# Patient Record
Sex: Male | Born: 1985 | Race: White | Hispanic: No | Marital: Single | Smoking: Never smoker
Health system: Southern US, Community
[De-identification: ages and names within clinical notes are randomized; demographics above are authoritative.]

## PROBLEM LIST (undated history)

## (undated) DIAGNOSIS — E669 Obesity, unspecified: Secondary | ICD-10-CM

## (undated) DIAGNOSIS — J302 Other seasonal allergic rhinitis: Secondary | ICD-10-CM

## (undated) DIAGNOSIS — I472 Ventricular tachycardia, unspecified: Secondary | ICD-10-CM

## (undated) HISTORY — DX: Ventricular tachycardia, unspecified: I47.20

## (undated) HISTORY — PX: V-TACH ABLATION: SHX6186

## (undated) HISTORY — PX: TONSILLECTOMY AND ADENOIDECTOMY: SUR1326

## (undated) HISTORY — DX: Other seasonal allergic rhinitis: J30.2

## (undated) HISTORY — DX: Obesity, unspecified: E66.9

## (undated) HISTORY — DX: Ventricular tachycardia: I47.2

---

## 2015-08-01 DIAGNOSIS — I472 Ventricular tachycardia, unspecified: Secondary | ICD-10-CM | POA: Insufficient documentation

## 2015-08-01 DIAGNOSIS — E669 Obesity, unspecified: Secondary | ICD-10-CM | POA: Insufficient documentation

## 2015-08-01 DIAGNOSIS — J302 Other seasonal allergic rhinitis: Secondary | ICD-10-CM | POA: Insufficient documentation

## 2015-08-15 ENCOUNTER — Ambulatory Visit (INDEPENDENT_AMBULATORY_CARE_PROVIDER_SITE_OTHER): Payer: BLUE CROSS/BLUE SHIELD | Admitting: Internal Medicine

## 2015-08-15 ENCOUNTER — Encounter (INDEPENDENT_AMBULATORY_CARE_PROVIDER_SITE_OTHER): Payer: Self-pay

## 2015-08-15 VITALS — BP 124/78 | HR 78 | Ht 69.0 in | Wt 230.6 lb

## 2015-08-15 DIAGNOSIS — I472 Ventricular tachycardia, unspecified: Secondary | ICD-10-CM

## 2015-08-15 NOTE — Progress Notes (Signed)
      HPI [name] is referred today by Dr. Eula ListenHussain for evaluation of palpitations. 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 In the interim he has had no reucrrent palpitations and no SVT symptoms or documented SVT. The patient was trying to lose weight and was taking phenteramine when he experienced recurrent palpitations which were thought to be due to sinus tachycardia. He has stopped these stimulants and his palpitations are essentially resolved. The patient has never had syncope. He has not been on any AV nodal blocking drugs. No Known Allergies   Current Outpatient Prescriptions  Medication Sig Dispense Refill  . fexofenadine (ALLEGRA) 180 MG tablet Take 180 mg by mouth daily.     No current facility-administered medications for this visit.     Past Medical History  Diagnosis Date  . V-tach (HCC)     HX  . Obesity   . Seasonal allergies     ROS:   All systems reviewed and negative except as noted in the HPI.   Past Surgical History  Procedure Laterality Date  . V-tach ablation  [id number]    [place]  . Tonsillectomy and adenoidectomy       Family History  Problem Relation Age of Onset  . Diabetes Father     DM  . Congestive Heart Failure Father     PACEMAKER  . Heart disease Father     PACEMAKER  . Healthy Mother   . Healthy Brother   . Healthy Sister      Social History   Social History  . Marital Status: Single    Spouse Name: N/A  . Number of Children: N/A  . Years of Education: N/A   Occupational History  . Not on file.   Social History Main Topics  . Smoking status: Never Smoker   . Smokeless tobacco: Not on file  . Alcohol Use: 0.0 oz/week    0 Standard drinks or equivalent per week     Comment: OCCASIONALLY  . Drug Use: Not on file  . Sexual Activity: Not on file   Other Topics Concern  . Not on file   Social History Narrative      BP 124/78 mmHg  Pulse 78  Ht 5\' 9"  (1.753 m)  Wt 230 lb 9.6 oz (104.599 kg)  BMI 34.04 kg/m2  Physical Exam:  Well appearing NAD HEENT: Unremarkable Neck:  No JVD, no thyromegally Lymphatics:  No adenopathy Back:  No CVA tenderness Lungs:  Clear HEART:  Regular rate rhythm, no murmurs, no rubs, no clicks Abd:  soft, positive bowel sounds, no organomegally, no rebound, no guarding Ext:  2 plus pulses, no edema, no cyanosis, no clubbing Skin:  No rashes no nodules Neuro:  CN II through XII intact, motor grossly intact  EKG - nsr with no pre-excitation and no atrial or ventricular arrhythmias   Assess/Plan: 1. Palpitations associate with the paient's use of phenteramine. - I do not think he has recurrent AVNRT. I have asked him to stop this weight loss medication.  2. Obesity - we discussed the different weight loss treatments. I have encouraged him to exercise regularly.  3. 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 He will undergo watchful waiting. I have instructed him to avoid strong stimulants.  Leonia ReevesGregg Taylor,M.D

## 2015-08-15 NOTE — Patient Instructions (Addendum)
Medication Instructions:  Your physician recommends that you continue on your current medications as directed. Please refer to the Current Medication list given to you today.  Labwork: None ordered  Testing/Procedures: None ordered  Follow-Up: No follow up is needed at this time with Dr. Taylor.  He will see you on an as needed basis.   If you need a refill on your cardiac medications before your next appointment, please call your pharmacy.  Thank you for choosing [facility]!!        

## 2016-01-11 DIAGNOSIS — M79672 Pain in left foot: Secondary | ICD-10-CM | POA: Diagnosis not present

## 2016-01-21 DIAGNOSIS — M79672 Pain in left foot: Secondary | ICD-10-CM | POA: Diagnosis not present

## 2016-01-26 DIAGNOSIS — M79672 Pain in left foot: Secondary | ICD-10-CM | POA: Diagnosis not present

## 2016-02-05 DIAGNOSIS — M79672 Pain in left foot: Secondary | ICD-10-CM | POA: Diagnosis not present

## 2016-03-16 DIAGNOSIS — J00 Acute nasopharyngitis [common cold]: Secondary | ICD-10-CM | POA: Diagnosis not present

## 2016-04-04 ENCOUNTER — Other Ambulatory Visit: Payer: Self-pay | Admitting: Internal Medicine

## 2016-04-04 ENCOUNTER — Ambulatory Visit
Admission: RE | Admit: 2016-04-04 | Discharge: 2016-04-04 | Disposition: A | Discharge status: Home / Self Care | Payer: BLUE CROSS/BLUE SHIELD | Source: Ambulatory Visit | Attending: Internal Medicine | Admitting: Internal Medicine

## 2016-04-04 DIAGNOSIS — M79671 Pain in right foot: Secondary | ICD-10-CM | POA: Diagnosis not present

## 2016-04-21 ENCOUNTER — Ambulatory Visit (INDEPENDENT_AMBULATORY_CARE_PROVIDER_SITE_OTHER): Payer: BLUE CROSS/BLUE SHIELD

## 2016-04-21 ENCOUNTER — Ambulatory Visit (INDEPENDENT_AMBULATORY_CARE_PROVIDER_SITE_OTHER): Payer: BLUE CROSS/BLUE SHIELD | Admitting: Podiatry

## 2016-04-21 ENCOUNTER — Encounter: Payer: Self-pay | Admitting: Podiatry

## 2016-04-21 VITALS — BP 135/97 | HR 79 | Resp 14 | Ht 69.0 in | Wt 230.0 lb

## 2016-04-21 DIAGNOSIS — M79673 Pain in unspecified foot: Secondary | ICD-10-CM | POA: Diagnosis not present

## 2016-04-21 DIAGNOSIS — M79671 Pain in right foot: Secondary | ICD-10-CM | POA: Diagnosis not present

## 2016-04-21 DIAGNOSIS — M79672 Pain in left foot: Secondary | ICD-10-CM | POA: Diagnosis not present

## 2016-04-21 DIAGNOSIS — R52 Pain, unspecified: Secondary | ICD-10-CM

## 2016-04-21 DIAGNOSIS — R29898 Other symptoms and signs involving the musculoskeletal system: Secondary | ICD-10-CM

## 2016-04-21 NOTE — Progress Notes (Signed)
   Subjective:  31 year old male is is the office today for evaluation of bilateral complaints. Patient has a history of a fracture in the right foot approximately a year and a half ago which was verified by MRI. Patient also has a history of this toe to the fourth digit left foot which occurred November 2017. Patient is also begin wearing over-the-counter insoles [facility]. Patient states that the insoles caused him some pain. Patient works in a [employer] and stands all day long on hard concrete floors in work boots    Objective/Physical Exam General: The patient is alert and oriented x3 in no acute distress.  Dermatology: Skin is warm, dry and supple bilateral lower extremities. Negative for open lesions or macerations.  Vascular: Palpable pedal pulses bilaterally. No edema or erythema noted. Capillary refill within normal limits.  Neurological: Epicritic and protective threshold grossly intact bilaterally.   Musculoskeletal Exam: Range of motion within normal limits to all pedal and ankle joints bilateral. Muscle strength 5/5 in all groups bilateral.   Radiographic Exam:  Normal osseous mineralization. Joint spaces preserved. No fracture/dislocation/boney destruction.    Assessment: #1 bilateral general foot fatigue   Plan of Care:  #1 Patient was evaluated. #2 todayI informed the patient that over-the-counter insoles are not necessarily mandatory due to his healthy foot structure and a negative history of symptoms. Patient began experiencing symptoms when he began wearing over-the-counter inserts #3 if the patient decides to continue wearing insoles I informed him that he needs to slowly break in the insoles #4 return to clinic when necessary    Felecia ShellingBrent M. Jamieon Lannen, DPM [facility]  Dr. Felecia ShellingBrent M. Prentice Sackrider, DPM    [address]                                        [place], [place] [address]                Office [phone]  Fax [phone]  [phone]

## 2016-05-02 DIAGNOSIS — J0100 Acute maxillary sinusitis, unspecified: Secondary | ICD-10-CM | POA: Diagnosis not present

## 2016-07-23 DIAGNOSIS — J019 Acute sinusitis, unspecified: Secondary | ICD-10-CM | POA: Diagnosis not present

## 2016-11-17 DIAGNOSIS — R0981 Nasal congestion: Secondary | ICD-10-CM | POA: Diagnosis not present

## 2016-11-17 DIAGNOSIS — R42 Dizziness and giddiness: Secondary | ICD-10-CM | POA: Diagnosis not present

## 2017-08-17 DIAGNOSIS — Z1389 Encounter for screening for other disorder: Secondary | ICD-10-CM | POA: Diagnosis not present

## 2017-08-17 DIAGNOSIS — Z13220 Encounter for screening for lipoid disorders: Secondary | ICD-10-CM | POA: Diagnosis not present

## 2017-08-17 DIAGNOSIS — Z0000 Encounter for general adult medical examination without abnormal findings: Secondary | ICD-10-CM | POA: Diagnosis not present

## 2017-11-05 DIAGNOSIS — M109 Gout, unspecified: Secondary | ICD-10-CM | POA: Diagnosis not present

## 2017-12-09 DIAGNOSIS — M79672 Pain in left foot: Secondary | ICD-10-CM | POA: Diagnosis not present

## 2017-12-10 ENCOUNTER — Other Ambulatory Visit: Payer: Self-pay | Admitting: Internal Medicine

## 2017-12-10 ENCOUNTER — Ambulatory Visit
Admission: RE | Admit: 2017-12-10 | Discharge: 2017-12-10 | Disposition: A | Discharge status: Home / Self Care | Payer: BLUE CROSS/BLUE SHIELD | Source: Ambulatory Visit | Attending: Internal Medicine | Admitting: Internal Medicine

## 2017-12-10 DIAGNOSIS — M79672 Pain in left foot: Secondary | ICD-10-CM | POA: Diagnosis not present

## 2018-01-15 DIAGNOSIS — M79671 Pain in right foot: Secondary | ICD-10-CM | POA: Diagnosis not present

## 2018-01-15 DIAGNOSIS — M10071 Idiopathic gout, right ankle and foot: Secondary | ICD-10-CM | POA: Diagnosis not present

## 2018-02-01 DIAGNOSIS — M79671 Pain in right foot: Secondary | ICD-10-CM | POA: Diagnosis not present

## 2018-02-09 DIAGNOSIS — M109 Gout, unspecified: Secondary | ICD-10-CM | POA: Diagnosis not present

## 2018-02-09 DIAGNOSIS — M79671 Pain in right foot: Secondary | ICD-10-CM | POA: Diagnosis not present

## 2018-07-13 DIAGNOSIS — M2669 Other specified disorders of temporomandibular joint: Secondary | ICD-10-CM | POA: Diagnosis not present

## 2018-08-19 DIAGNOSIS — Z8249 Family history of ischemic heart disease and other diseases of the circulatory system: Secondary | ICD-10-CM | POA: Diagnosis not present

## 2018-08-19 DIAGNOSIS — M109 Gout, unspecified: Secondary | ICD-10-CM | POA: Diagnosis not present

## 2018-08-19 DIAGNOSIS — Z1389 Encounter for screening for other disorder: Secondary | ICD-10-CM | POA: Diagnosis not present

## 2018-08-19 DIAGNOSIS — Z0000 Encounter for general adult medical examination without abnormal findings: Secondary | ICD-10-CM | POA: Diagnosis not present

## 2018-08-23 DIAGNOSIS — Z20828 Contact with and (suspected) exposure to other viral communicable diseases: Secondary | ICD-10-CM | POA: Diagnosis not present

## 2019-08-29 DIAGNOSIS — Z0000 Encounter for general adult medical examination without abnormal findings: Secondary | ICD-10-CM | POA: Diagnosis not present

## 2019-08-29 DIAGNOSIS — Z13220 Encounter for screening for lipoid disorders: Secondary | ICD-10-CM | POA: Diagnosis not present

## 2019-08-29 DIAGNOSIS — M109 Gout, unspecified: Secondary | ICD-10-CM | POA: Diagnosis not present

## 2019-08-29 DIAGNOSIS — J302 Other seasonal allergic rhinitis: Secondary | ICD-10-CM | POA: Diagnosis not present

## 2020-02-17 ENCOUNTER — Other Ambulatory Visit: Payer: 59

## 2020-02-17 DIAGNOSIS — Z20822 Contact with and (suspected) exposure to covid-19: Secondary | ICD-10-CM

## 2020-02-19 LAB — NOVEL CORONAVIRUS, NAA: SARS-CoV-2, NAA: NOT DETECTED

## 2020-02-19 LAB — SARS-COV-2, NAA 2 DAY TAT

## 2020-04-13 IMAGING — CR DG FOOT 2V*L*
2 series · 2 of 2 positions shown · non-contrast
Comparison: None.

CLINICAL DATA: Acute left foot pain without known injury.

EXAM:
LEFT FOOT - 2 VIEW

[t foot ap left]
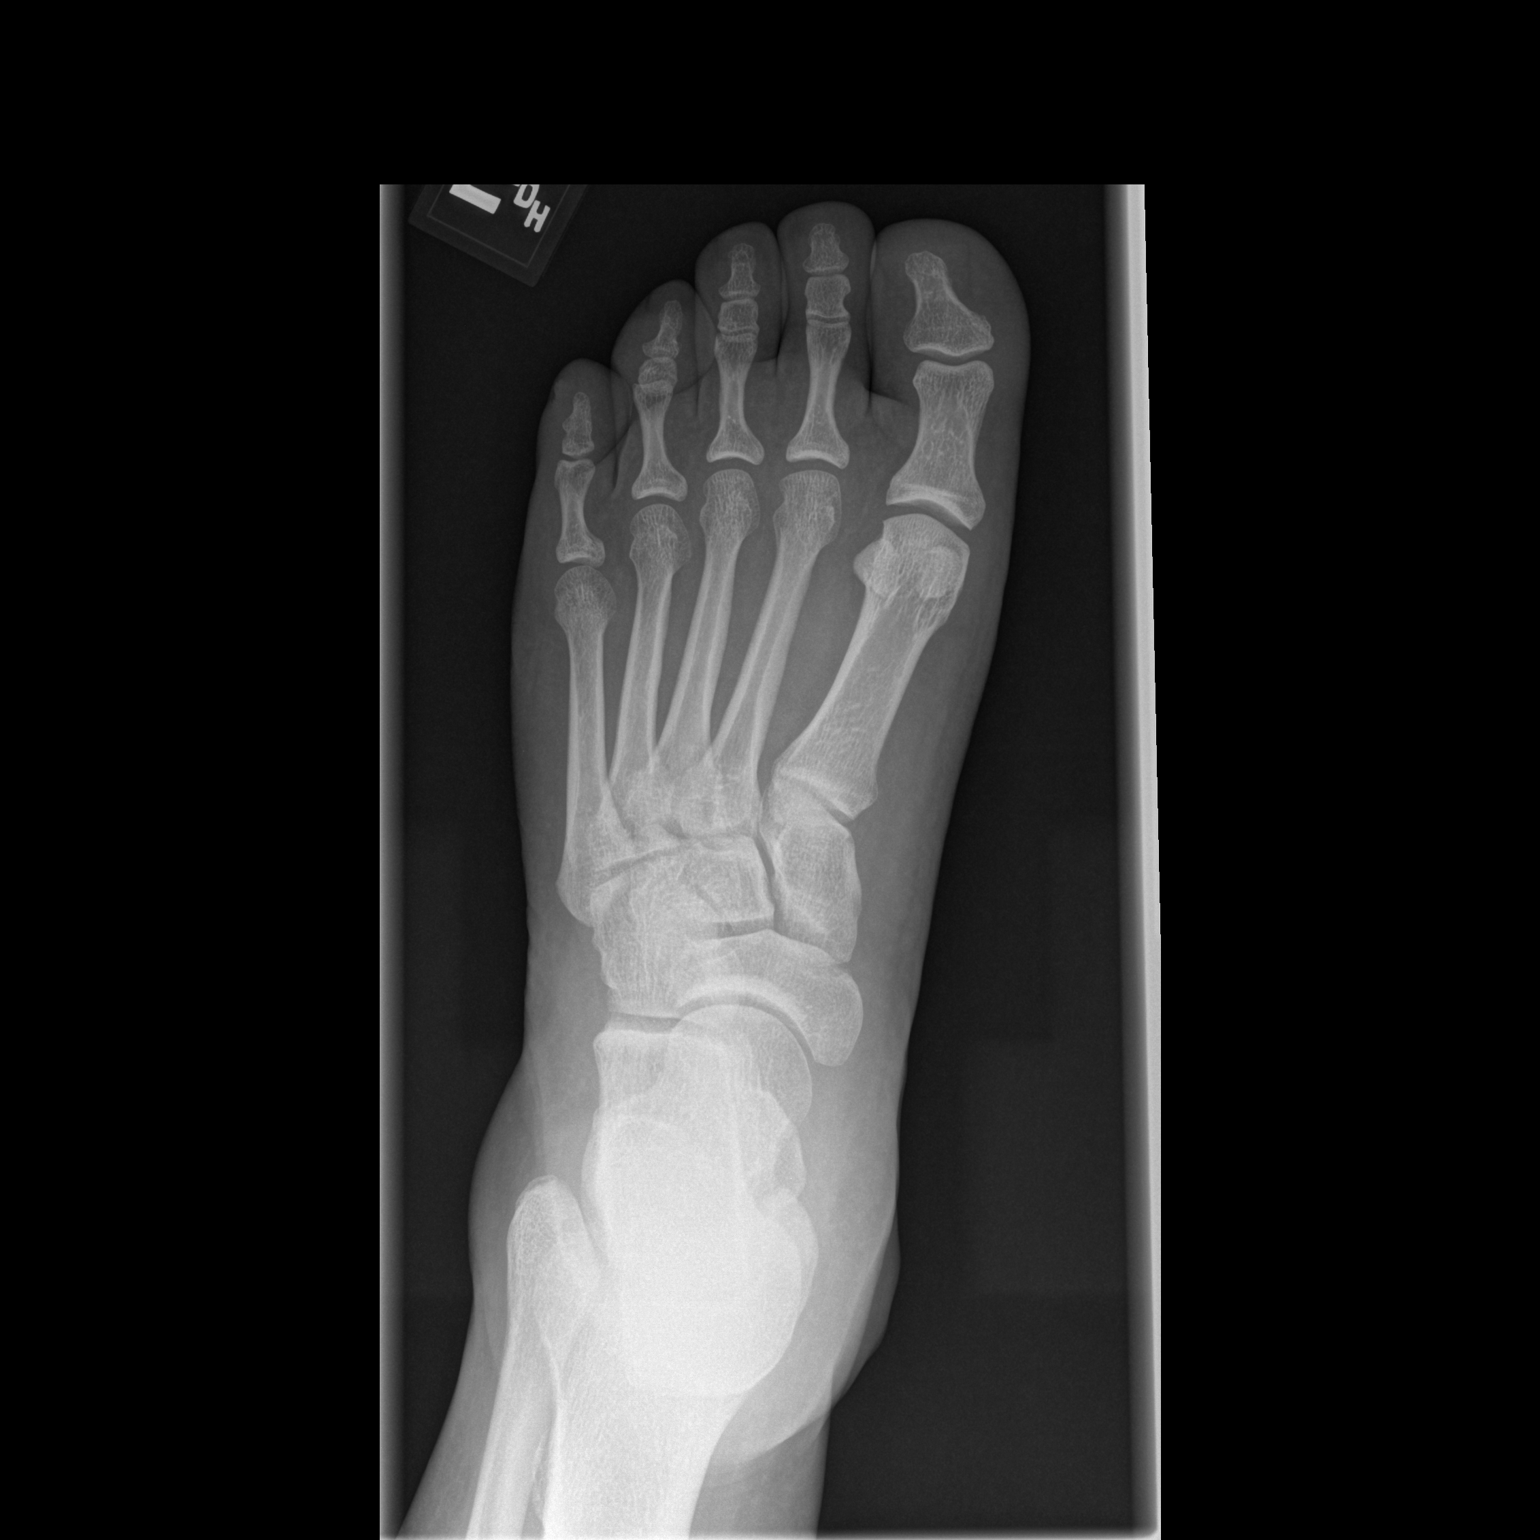

[t foot lat left]
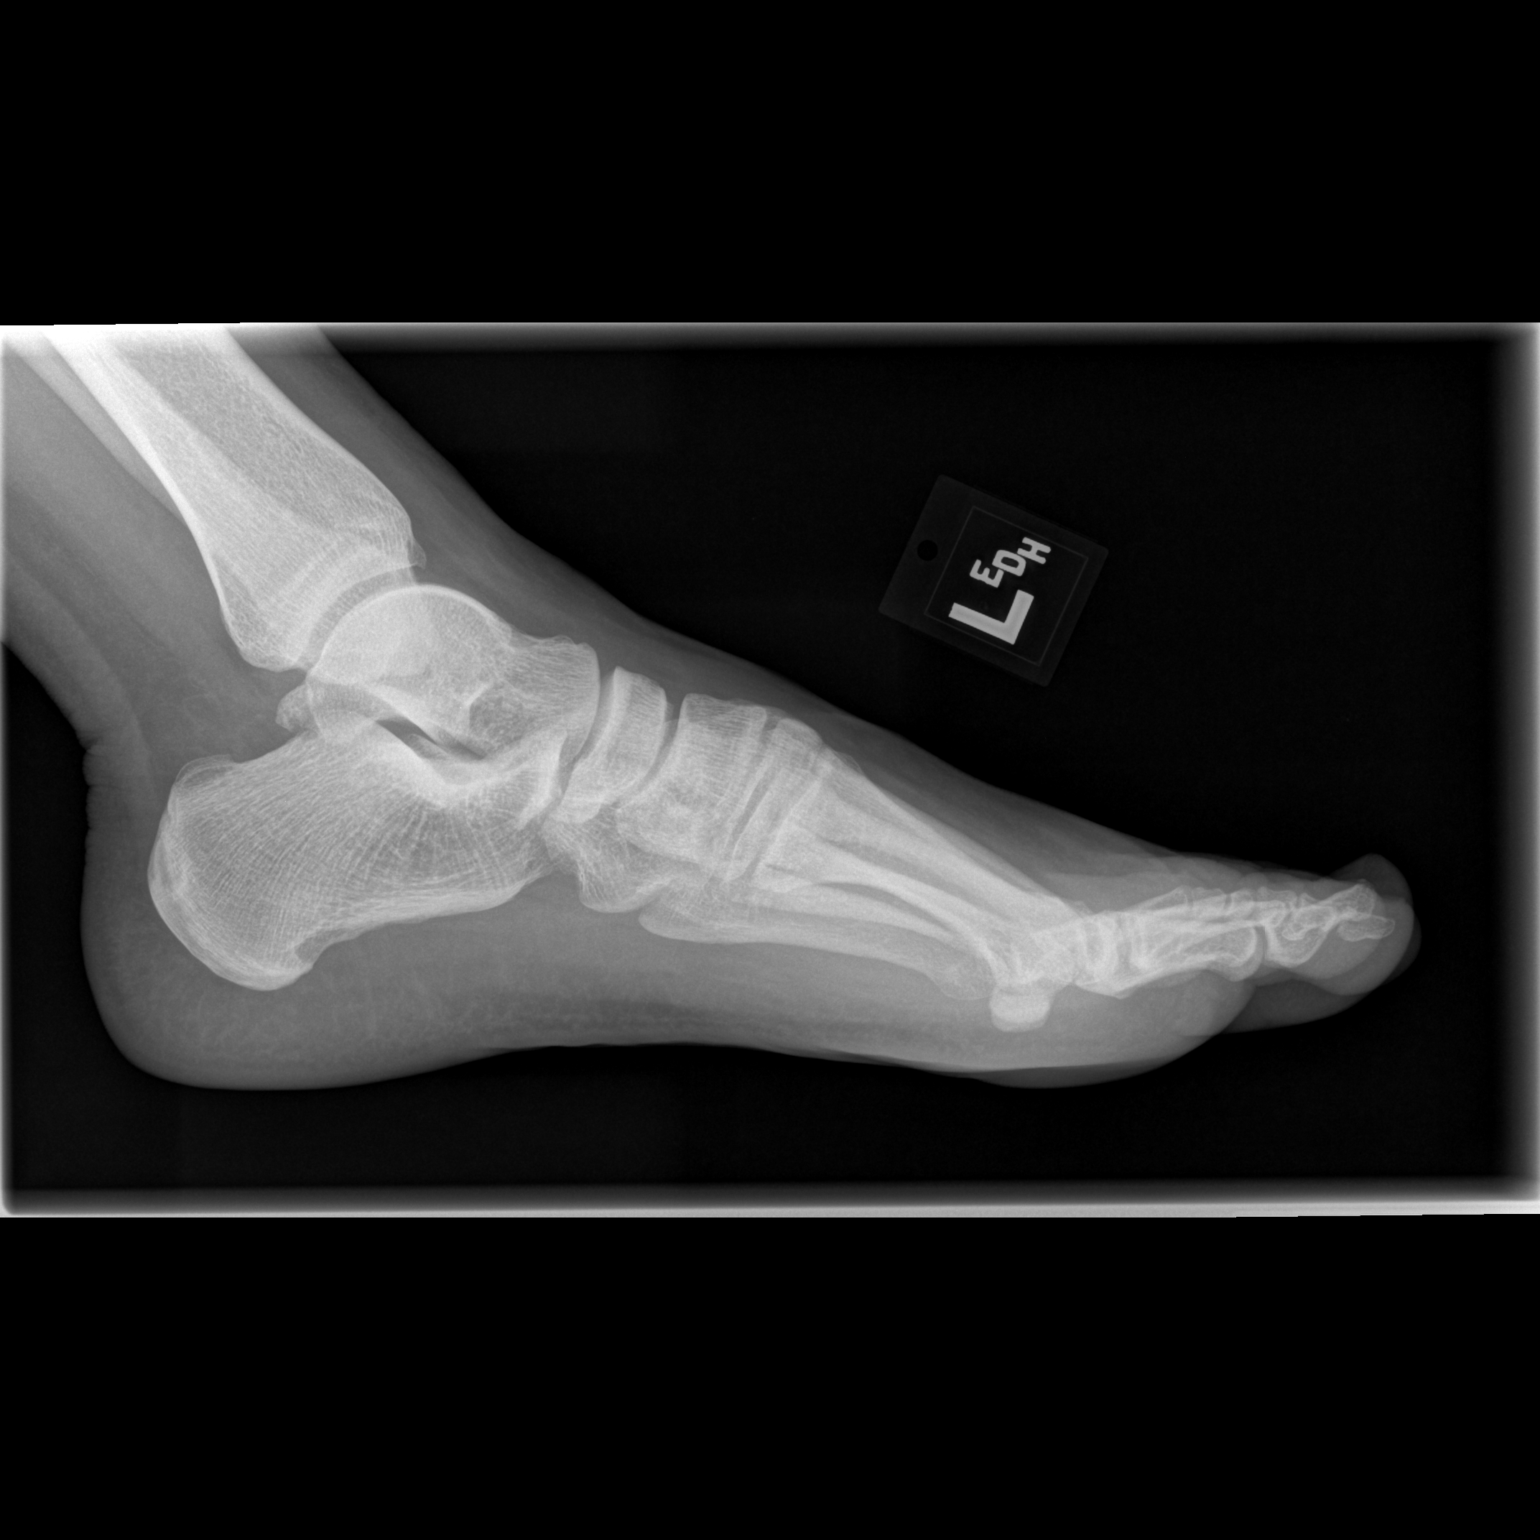

[2 of 2 positions shown; findings below may reference images not displayed]

FINDINGS: There is no evidence of fracture or dislocation. There is no
evidence of arthropathy or other focal bone abnormality. Soft
tissues are unremarkable.
IMPRESSION: Negative.

## 2020-06-24 ENCOUNTER — Encounter (HOSPITAL_COMMUNITY): Payer: Self-pay | Admitting: Emergency Medicine

## 2020-06-24 ENCOUNTER — Ambulatory Visit (HOSPITAL_COMMUNITY)
Admission: EM | Admit: 2020-06-24 | Discharge: 2020-06-24 | Disposition: A | Discharge status: Home / Self Care | Payer: 59

## 2020-06-24 ENCOUNTER — Other Ambulatory Visit: Payer: Self-pay

## 2020-06-24 DIAGNOSIS — H5789 Other specified disorders of eye and adnexa: Secondary | ICD-10-CM

## 2020-06-24 MED ORDER — TETRACAINE HCL 0.5 % OP SOLN
OPHTHALMIC | Status: AC
  Filled 2020-06-24: qty 4

## 2020-06-24 NOTE — Discharge Instructions (Addendum)
Gel or lubricated eye drops or ointment

## 2020-06-24 NOTE — ED Provider Notes (Signed)
[facility]    CSN: [id number] Arrival date & time: 06/24/20  1659      History   Chief Complaint Chief Complaint  Patient presents with  . Eye Pain  . Blurred Vision    HPI Jordan Singleton is a 35 y.o. male.   HPI   Eye Pain: Pt reports that when he put in his contact in this morning he felt as if his eyes felt dry and he has some blurry vision of both eyes. He notes some potential photophobia when out in the bright sun today but none currently. He has removed his contacts and tried flushing his eyes with contact solution but this caused a burning sensation. He denies fever, eye pain, headache, vomiting, eye injury, trauma.    Past Medical History:  Diagnosis Date  . Obesity   . Seasonal allergies   . V-tach [facility])    HX    Patient Active Problem List   Diagnosis Date Noted  . V-tach (HCC)   . Obesity   . Seasonal allergies     Past Surgical History:  Procedure Laterality Date  . TONSILLECTOMY AND ADENOIDECTOMY    . V-TACH ABLATION  2007&2008   In [place]       Home Medications    Prior to Admission medications   Medication Sig Start Date End Date Taking? Authorizing Provider  fexofenadine (ALLEGRA) 180 MG tablet Take 180 mg by mouth daily.    [provider]    Family History Family History  Problem Relation Age of Onset  . Diabetes Father        DM  . Congestive Heart Failure Father        PACEMAKER  . Heart disease Father        PACEMAKER  . Healthy Mother   . Healthy Brother   . Healthy Sister     Social History Social History   Tobacco Use  . Smoking status: Never Smoker  . Smokeless tobacco: Never Used  Substance Use Topics  . Alcohol use: Yes    Alcohol/week: 0.0 standard drinks    Comment: OCCASIONALLY     Allergies   Patient has no known allergies.   Review of Systems Review of Systems  As stated above in HPI Physical Exam Triage Vital Signs ED Triage Vitals  Enc Vitals Group     BP 06/24/20  1804 118/78     Pulse Rate 06/24/20 1804 80     Resp 06/24/20 1804 17     Temp 06/24/20 1804 98.1 F (36.7 C)     Temp Source 06/24/20 1804 Oral     SpO2 06/24/20 1804 100 %     Weight --      Height --      Head Circumference --      Peak Flow --      Pain Score 06/24/20 1808 0     Pain Loc --      Pain Edu? --      Excl. in GC? --    No data found.  Updated Vital Signs BP 118/78 (BP Location: Left Arm)   Pulse 80   Temp 98.1 F (36.7 C) (Oral)   Resp 17   SpO2 100%   Visual Acuity Right Eye Distance: 20/40 (With correction) Left Eye Distance: 20/40 (With correction) Bilateral Distance: 20/30 (With correction)  Right Eye Near:   Left Eye Near:    Bilateral Near:     Physical Exam Vitals and  nursing note reviewed.  Constitutional:      General: He is not in acute distress.    Appearance: Normal appearance. He is not ill-appearing, toxic-appearing or diaphoretic.  HENT:     Head: Normocephalic and atraumatic.  Eyes:     General: Lids are normal. Lids are everted, no foreign bodies appreciated. Vision grossly intact. Gaze aligned appropriately. No allergic shiner, visual field deficit or scleral icterus.       Right eye: No foreign body, discharge or hordeolum.        Left eye: No foreign body, discharge or hordeolum.     Extraocular Movements: Extraocular movements intact.     Right eye: Normal extraocular motion and no nystagmus.     Left eye: Normal extraocular motion and no nystagmus.     Conjunctiva/sclera: Conjunctivae normal.     Right eye: Right conjunctiva is not injected. No chemosis, exudate or hemorrhage.    Left eye: Left conjunctiva is not injected. No chemosis, exudate or hemorrhage.    Pupils: Pupils are equal, round, and reactive to light.     Right eye: No corneal abrasion or fluorescein uptake.     Left eye: No corneal abrasion or fluorescein uptake.     Comments: No pain with EOMs. No photophobia   Neurological:     Mental Status: He is  alert.      UC Treatments / Results  Labs (all labs ordered are listed, but only abnormal results are displayed) Labs Reviewed - No data to display  EKG   Radiology No results found.  Procedures Procedures (including critical care time)  Medications Ordered in UC Medications - No data to display  Initial Impression / Assessment and Plan / UC Course  I have reviewed the triage vital signs and the nursing notes.  Pertinent labs & imaging results that were available during my care of the patient were reviewed by me and considered in my medical decision making (see chart for details).     New. Wide differential. No red flag signs or symptoms. May be early conjunctivitis as his vision appears stable. Will trial gel or lubricated eye drops until he can be seen for follow up tomorrow by his eye specialist. Discussed red flag signs and symptoms.     Final Clinical Impressions(s) / UC Diagnoses   Final diagnoses:  None   Discharge Instructions   None    ED Prescriptions    None     PDMP not reviewed this encounter.   Rushie Chestnut, [place] 06/24/20 1901

## 2020-06-24 NOTE — ED Triage Notes (Signed)
Pt presents with blurred vision and burning in eyes that started this am.
